# Patient Record
Sex: Female | Born: 1990 | Race: Black or African American | Hispanic: No | Marital: Single | Smoking: Never smoker
Health system: Southern US, Community
[De-identification: ages and names within clinical notes are randomized; demographics above are authoritative.]

---
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
## 2011-08-16 ENCOUNTER — Other Ambulatory Visit (HOSPITAL_COMMUNITY)
Admission: RE | Admit: 2011-08-16 | Discharge: 2011-08-16 | Disposition: A | Discharge status: Home / Self Care | Payer: Self-pay | Source: Ambulatory Visit | Attending: Internal Medicine | Admitting: Internal Medicine

## 2011-08-16 DIAGNOSIS — Z01419 Encounter for gynecological examination (general) (routine) without abnormal findings: Secondary | ICD-10-CM | POA: Insufficient documentation

## 2013-07-30 ENCOUNTER — Encounter (HOSPITAL_COMMUNITY): Payer: Self-pay | Admitting: Emergency Medicine

## 2013-07-30 ENCOUNTER — Emergency Department (INDEPENDENT_AMBULATORY_CARE_PROVIDER_SITE_OTHER)
Admission: EM | Admit: 2013-07-30 | Discharge: 2013-07-30 | Disposition: A | Discharge status: Home / Self Care | Source: Home / Self Care | Attending: Family Medicine | Admitting: Family Medicine

## 2013-07-30 DIAGNOSIS — J00 Acute nasopharyngitis [common cold]: Secondary | ICD-10-CM

## 2013-07-30 DIAGNOSIS — J040 Acute laryngitis: Secondary | ICD-10-CM

## 2013-07-30 MED ORDER — IPRATROPIUM BROMIDE 0.06 % NA SOLN: 2.0000 | Freq: Four times a day (QID) | NASAL | Status: DC

## 2013-07-30 MED ORDER — HYDROCOD POLST-CHLORPHEN POLST 10-8 MG/5ML PO LQCR: 2.5000 mL | Freq: Two times a day (BID) | ORAL | Status: DC | PRN

## 2013-07-30 MED ORDER — DOXYCYCLINE HYCLATE 100 MG PO CAPS: 100.0000 mg | ORAL_CAPSULE | Freq: Two times a day (BID) | ORAL | Status: DC

## 2013-07-30 MED ORDER — METHYLPREDNISOLONE 4 MG PO KIT: PACK | ORAL | Status: DC

## 2013-07-30 NOTE — ED Provider Notes (Signed)
CSN: [id number]     Arrival date & time 07/30/13  1119 History   First MD Initiated Contact with Patient 07/30/13 1330     Chief Complaint  Patient presents with  . Sore Throat   (Consider location/radiation/quality/duration/timing/severity/associated sxs/prior Treatment) HPI Comments: 22 year old female presents complaining of being sick for approximately one week. Started one week ago, she began having a sore throat. The sore throat has been persistent and has gotten worse over the period second day, she had vomiting throughout the day but has not had any more nausea or vomiting. The next day, she began to develop a cough. The cough has been dry, persistent, and has been keeping her awake at night. Starting this morning, she began to have hoarseness in her voice. She had some shortness of breath last night as well. She has been taking over-the-counter medications with only mild relief of her symptoms. No fever, chills, abdominal pain, lightheadedness, rash, chest pain  Patient is a 22 y.o. female presenting with pharyngitis.  Sore Throat Associated symptoms include shortness of breath. Pertinent negatives include no chest pain and no abdominal pain.    History reviewed. No pertinent past medical history. History reviewed. No pertinent past surgical history. History reviewed. No pertinent family history. History  Substance Use Topics  . Smoking status: Never Smoker   . Smokeless tobacco: Not on file  . Alcohol Use: No   OB History   Grav Para Term Preterm Abortions TAB SAB Ect Mult Living                 Review of Systems  Constitutional: Negative for fever and chills.  HENT: Positive for congestion, postnasal drip, rhinorrhea and sore throat. Negative for ear pain, sinus pressure and sneezing.   Eyes: Negative for visual disturbance.  Respiratory: Positive for cough and shortness of breath. Negative for chest tightness.   Cardiovascular: Negative for chest pain, palpitations and  leg swelling.  Gastrointestinal: Positive for vomiting. Negative for nausea and abdominal pain.  Endocrine: Negative for polydipsia and polyuria.  Genitourinary: Negative for dysuria, urgency and frequency.  Musculoskeletal: Negative for arthralgias and myalgias.  Skin: Negative for rash.  Neurological: Negative for dizziness, weakness and light-headedness.    Allergies  Review of patient's allergies indicates no known allergies.  Home Medications   Current Outpatient Rx  Name  Route  Sig  Dispense  Refill  . chlorpheniramine-HYDROcodone (TUSSIONEX PENNKINETIC ER) 10-8 MG/5ML LQCR   Oral   Take 2.5-5 mLs by mouth every 12 (twelve) hours as needed for cough.   115 mL   0   . doxycycline (VIBRAMYCIN) 100 MG capsule   Oral   Take 1 capsule (100 mg total) by mouth 2 (two) times daily.   20 capsule   0   . ipratropium (ATROVENT) 0.06 % nasal spray   Each Nare   Place 2 sprays into both nostrils 4 (four) times daily.   15 mL   12   . methylPREDNISolone (MEDROL DOSEPAK) 4 MG tablet      follow package directions   21 tablet   0     Dispense as written.    BP 112/70  Pulse 66  Temp(Src) 98.5 F (36.9 C) (Oral)  Resp 16  SpO2 100%  LMP 07/16/2013 Physical Exam  Nursing note and vitals reviewed. Constitutional: She is oriented to person, place, and time. Vital signs are normal. She appears well-developed and well-nourished. No distress.  HENT:  Head: Normocephalic and atraumatic.  Right Ear:  External ear normal.  Left Ear: External ear normal.  Nose: Nose normal.  Mouth/Throat: Oropharynx is clear and moist. No oropharyngeal exudate.  Eyes: Conjunctivae are normal. Right eye exhibits no discharge. Left eye exhibits no discharge.  Neck: Normal range of motion. Neck supple.  Cardiovascular: Normal rate, regular rhythm and normal heart sounds.   Pulmonary/Chest: Effort normal and breath sounds normal. No respiratory distress. She has no wheezes. She has no rales.   Abdominal: Soft. There is no tenderness.  Lymphadenopathy:    She has cervical adenopathy (tonsillar on the right only ).  Neurological: She is alert and oriented to person, place, and time. She has normal strength. Coordination normal.  Skin: Skin is warm and dry. No rash noted. She is not diaphoretic.  Psychiatric: She has a normal mood and affect. Judgment normal.    ED Course  Procedures (including critical care time) Labs Review Labs Reviewed - No data to display Imaging Review No results found.    MDM   1. Acute nasopharyngitis (common cold)   2. Laryngitis    Patient is afebrile, nontoxic. Her symptoms have gotten worse over one week.  We'll treat symptomatically for 2 days and give postdated prescription for antibiotics, she will wait 2 days prior to filling antibiotic to see if she starts to get better. Followup PRN   New Prescriptions   CHLORPHENIRAMINE-HYDROCODONE (TUSSIONEX PENNKINETIC ER) 10-8 MG/5ML LQCR    Take 2.5-5 mLs by mouth every 12 (twelve) hours as needed for cough.   DOXYCYCLINE (VIBRAMYCIN) 100 MG CAPSULE    Take 1 capsule (100 mg total) by mouth 2 (two) times daily.   IPRATROPIUM (ATROVENT) 0.06 % NASAL SPRAY    Place 2 sprays into both nostrils 4 (four) times daily.   METHYLPREDNISOLONE (MEDROL DOSEPAK) 4 MG TABLET    follow package directions       Graylon Good, PA-C 07/30/13 1347

## 2013-07-30 NOTE — ED Provider Notes (Signed)
Medical screening examination/treatment/procedure(s) were performed by resident physician or non-physician practitioner and as supervising physician I was immediately available for consultation/collaboration.   KINDL,JAMES DOUGLAS MD.   James D Kindl, MD 07/30/13 1651 

## 2013-07-30 NOTE — ED Notes (Signed)
C/o productive cough with yellow sputum. Chills and sore throat.  Denies fever, n/v/d.  Pt has tried thera flu with no relief.  On set Sunday.

## 2013-09-13 ENCOUNTER — Encounter (HOSPITAL_COMMUNITY): Payer: Self-pay | Admitting: Emergency Medicine

## 2013-09-13 ENCOUNTER — Emergency Department (INDEPENDENT_AMBULATORY_CARE_PROVIDER_SITE_OTHER)
Admission: EM | Admit: 2013-09-13 | Discharge: 2013-09-13 | Disposition: A | Discharge status: Home / Self Care | Source: Home / Self Care

## 2013-09-13 ENCOUNTER — Emergency Department (INDEPENDENT_AMBULATORY_CARE_PROVIDER_SITE_OTHER)

## 2013-09-13 DIAGNOSIS — R0989 Other specified symptoms and signs involving the circulatory and respiratory systems: Secondary | ICD-10-CM

## 2013-09-13 DIAGNOSIS — R6889 Other general symptoms and signs: Secondary | ICD-10-CM

## 2013-09-13 NOTE — ED Provider Notes (Signed)
CSN: [id number]     Arrival date & time 09/13/13  0932 History   First MD Initiated Contact with Patient 09/13/13 0957     Chief Complaint  Patient presents with  . Swallowed Foreign Body   (Consider location/radiation/quality/duration/timing/severity/associated sxs/prior Treatment) HPI Comments: 23 year old female was eating pizza last and then suddenly felt a foreign body sensation in the anterior throat at the level of the thyroid cartilage. This occurred approximately 11 PM. She is able to swallow but it causes discomfort in the throat upon swallowing. She has no drooling and she is not spitting out fluids or saliva. She is able to drink water and other fluids and eat crackers without problems. She has noted sensation of choking problems breathing or cough. She is sitting on the exam table talking and sitting in a relaxed position. She is in no distress.   History reviewed. No pertinent past medical history. History reviewed. No pertinent past surgical history. History reviewed. No pertinent family history. History  Substance Use Topics  . Smoking status: Never Smoker   . Smokeless tobacco: Not on file  . Alcohol Use: No   OB History   Grav Para Term Preterm Abortions TAB SAB Ect Mult Living                 Review of Systems  Constitutional: Negative.   HENT: Positive for postnasal drip, rhinorrhea and sinus pressure. Negative for congestion and sore throat.        As per history of present illness Airway is widely patent, no stridor or other abnormal sounds from the throat,  Respiratory: Negative.   Cardiovascular: Negative.   Gastrointestinal: Negative.   Skin: Negative.     Allergies  Review of patient's allergies indicates no known allergies.  Home Medications   No current outpatient prescriptions on file. BP 110/78  Pulse 78  Temp(Src) 98.6 F (37 C) (Oral)  Resp 16  SpO2 97%  LMP 08/19/2013 Physical Exam  Nursing note and vitals reviewed. Constitutional:  She is oriented to person, place, and time. She appears well-developed and well-nourished. No distress.  HENT:  Mouth/Throat: No oropharyngeal exudate.  Oropharynx is mildly erythema, cobblestoning and clear PND. Airway is widely patent there are no abnormal lesions, swelling or signs of abscess formation.  Eyes: Conjunctivae and EOM are normal.  Neck: Normal range of motion. Neck supple.  Bilateral cervical anterior chain lymph nodes, small, mobile and nontender.  Cardiovascular: Normal rate, regular rhythm and normal heart sounds.   Pulmonary/Chest: Effort normal and breath sounds normal. No respiratory distress.  Lymphadenopathy:    She has cervical adenopathy.  Neurological: She is alert and oriented to person, place, and time. She exhibits normal muscle tone.  Skin: Skin is warm and dry.  Psychiatric: She has a normal mood and affect.    ED Course  Procedures (including critical care time) Labs Review Labs Reviewed - No data to display Imaging Review Dg Neck Soft Tissue  09/13/2013   CLINICAL DATA:  Swallowed a foreign body.  EXAM: NECK SOFT TISSUES - 1+ VIEW  COMPARISON:  None.  FINDINGS: There is no evidence of retropharyngeal soft tissue swelling or epiglottic enlargement. The cervical airway is unremarkable and no radio-opaque foreign body identified. There is calcification along the posterior margin of the thyroid cartilage.  IMPRESSION: Negative.   Electronically Signed   By: Amie Portlandavid  Ormond M.D.   On: 09/13/2013 10:44      MDM   1. Foreign body sensation in throat  For problems discussed and written on your instructions  go to to the ED Otherwise call the ENT above for appointment    Hayden Rasmussen, NP 09/13/13 1118

## 2013-09-13 NOTE — ED Notes (Signed)
PT    HAS  A  SENSATION OF  SOMETHING  IIN   HER  THROAT  AFTER  EATING  PIZZA     LAST  PM   SHE  APPEARS IN NO  DOISTRESS   SITTING  UPRIGHT ON  EXAM TABLE  IN NO  ACUTE  DISTRESS

## 2013-09-13 NOTE — Discharge Instructions (Signed)
Choking, Adult For problems with breathing, swallowing, drooling, choking go to the emergency department Choking occurs when a food or object gets stuck in the throat or trachea, blocking the airway. If the airway is partly blocked, coughing will usually cause the food or object to come out. If the airway is completely blocked, immediate action is needed to help it come out. A complete airway blockage is life-threatening because it causes breathing to stop. Choking is a true medical emergency that requires fast, appropriate action by anyone available. SIGNS OF AIRWAY BLOCKAGE There is a partial airway blockage if you or the person who is choking is:   Able to breathe and speak.  Coughing loudly.  Making loud noises. There is a complete airway blockage if you or the person who is choking is:   Unable to breathe.  Making soft or high-pitched sounds while breathing.  Unable to cough or coughing weakly, ineffectively, or silently.  Unable to cry, speak, or make sounds.  Turning blue.  Holding the neck with both arms. This is the universal sign of choking. WHAT TO DO IF CHOKING OCCURS If there is a partial airway blockage, allow coughing to clear the airway. Do not try to drink until the food or object comes out. If someone else has a partial airway blockage, do not interfere. Stay with him or her and watch for signs of complete airway blockage until the food or object comes out.  If there is a complete airway blockage or if there is a partial airway blockage and the food or object does not come out, perform abdominal thrusts (also referred to as the Heimlich maneuver). Abdominal thrusts are used to create an artificial cough to try to clear the airway. Performing abdominal thrusts is part of a series of steps that should be done to help someone who is choking. Abdominal thrusts are usually done by someone else, but if you are alone, you can perform abdominal thrusts on yourself. Follow the  procedure below that best fits your situation.  IF SOMEONE ELSE IS CHOKING: For a conscious adult:  1. Ask the person whether he or she is choking. If the person nods, continue to step 2. 2. Stand or kneel behind the person and lean him or her forward slightly. 3. Make a fist with 1 hand, put your arms around the person, and grasp your fist with your other hand. Place the thumb side of your fist in the person's abdomen, just below the ribs. 4. Press inward and upward with both hands. 5. Repeat this maneuver until the object comes out and the person is able to breathe or until the person loses consciousness. For an unconcious adult: 1. Shout for help. If someone responds, have him or her call local emergency services (911 in [place] If no one responds, call local emergency services yourself if possible. 2. Begin CPR, starting with compressions. Every time you open the airway to give rescue breaths, open the person's mouth. If you can see the food or object and it can be easily pulled out, remove it with your fingers. 3. After 5 cycles or 2 minutes of CPR, call local emergency services (911 in [place] if you or someone else did not already call. For a conscious adult who is obese or in the later stages of pregnancy: Abdominal thrusts may not be effective when helping people who are in the later stages of pregnancy or who are obese. In these instances, chest thrusts can be used.  1. Ask  the person whether he or she is choking. If the person nods and has signs of complete airway blockage, continue to step 2. 2. Stand behind the person and wrap your arms around his or her chest (with your arms under the person's armpits). 3. Make a fist with 1 hand. Place the thumb side of your fist on the middle of the person's breastbone. 4. Grab your fist with your other hand and thrust backward. Continue this until the object comes out or until the person becomes unconscious. For an unconscious adult who is obese or in  the later stages of pregnancy:  1. Shout for help. If someone responds, have him or her call local emergency services (911 in [place] If no one responds, call local emergency services yourself if possible. 2. Begin CPR, starting with compressions. Every time you open the airway to give rescue breaths, open the person's mouth. If you can see the food or object and it can be easily pulled out, remove it with your fingers. 3. After 5 cycles or 2 minutes of CPR, call local emergency services (911 in [place] if you or someone else did not already call. Note that abdominal thrusts (below the rib cage) should be used for a pregnant woman if possible. This should be possible until the later stages of pregnancy when there is no longer enough room between the enlarging uterus and the rib cage to perform the maneuver. At that point, chest thrusts must be used as described. IF YOU ARE CHOKING: 1. Call local emergency services (911 in [place] if near a landline. Do not worry about communicating what is happening. Do not hang up the phone. Someone may be sent to help you anyway. 2. Make a fist with 1 hand. Put the thumb side of the fist against your stomach, just above the belly button and well below the breastbone. If you are pregnant or obese, put your fist on your chest instead, just below the breastbone and just above your lowest ribs. 3. Hold your fist with your other hand and bend over a hard surface, such as a table or chair. 4. Forcefully push your fist in and up. 5. Continue to do this until the food or object comes out. PREVENTION  To be prepared if choking occurs, learn how to correctly perform abdominal thrusts and give CPR by taking a certified first-aid training course.  SEEK IMMEDIATE MEDICAL CARE IF:  You have a fever after choking stops.  You have problems breathing after choking stops.  You received the Heimlich maneuver. MAKE SURE YOU:  Understand these instructions.  Watch your  condition.  Get help right away if you are not doing well or get worse. Document Released: 09/05/2004 Document Revised: 04/22/2012 Document Reviewed: 03/10/2012 [facility] Patient Information 2014 [place], [place].

## 2013-09-16 NOTE — ED Provider Notes (Signed)
Medical screening examination/treatment/procedure(s) were performed by a resident physician or non-physician practitioner and as the supervising physician I was immediately available for consultation/collaboration.  Clementeen GrahamEvan Corey, MD    Rodolph BongEvan S Corey, MD 09/16/13 [phone]

## 2013-11-09 ENCOUNTER — Encounter (HOSPITAL_COMMUNITY): Payer: Self-pay | Admitting: Emergency Medicine

## 2013-11-09 ENCOUNTER — Emergency Department (INDEPENDENT_AMBULATORY_CARE_PROVIDER_SITE_OTHER)
Admission: EM | Admit: 2013-11-09 | Discharge: 2013-11-09 | Disposition: A | Discharge status: Home / Self Care | Payer: Self-pay | Source: Home / Self Care | Attending: Emergency Medicine | Admitting: Emergency Medicine

## 2013-11-09 DIAGNOSIS — S39012A Strain of muscle, fascia and tendon of lower back, initial encounter: Secondary | ICD-10-CM

## 2013-11-09 DIAGNOSIS — S139XXA Sprain of joints and ligaments of unspecified parts of neck, initial encounter: Secondary | ICD-10-CM

## 2013-11-09 DIAGNOSIS — S161XXA Strain of muscle, fascia and tendon at neck level, initial encounter: Secondary | ICD-10-CM

## 2013-11-09 MED ORDER — MELOXICAM 15 MG PO TABS: 15.0000 mg | ORAL_TABLET | Freq: Every day | ORAL | Status: AC

## 2013-11-09 MED ORDER — CYCLOBENZAPRINE HCL 5 MG PO TABS: 5.0000 mg | ORAL_TABLET | Freq: Three times a day (TID) | ORAL | Status: AC | PRN

## 2013-11-09 MED ORDER — HYDROCODONE-ACETAMINOPHEN 5-325 MG PO TABS: ORAL_TABLET | ORAL | Status: AC

## 2013-11-09 NOTE — Discharge Instructions (Signed)
Do exercises twice daily followed by moist heat for 15 minutes. ° ° ° ° ° °Try to be as active as possible. ° °If no better in 2 weeks, follow up with orthopedist. ° ° °TREATMENT  °Treatment initially involves the use of ice and medication to help reduce pain and inflammation. It is also important to perform strengthening and stretching exercises and modify activities that worsen symptoms so the injury does not get worse. These exercises may be performed at home or with a therapist. For patients who experience severe symptoms, a soft padded collar may be recommended to be worn around the neck.  °Improving your posture may help reduce symptoms. Posture improvement includes pulling your chin and abdomen in while sitting or standing. If you are sitting, sit in a firm chair with your buttocks against the back of the chair. While sleeping, try replacing your pillow with a small towel rolled to 2 inches in diameter, or use a cervical pillow. Poor sleeping positions delay healing.  ° °MEDICATION  °· If pain medication is necessary, nonsteroidal anti-inflammatory medications, such as aspirin and ibuprofen, or other minor pain relievers, such as acetaminophen, are often recommended. °· Do not take pain medication for 7 days before surgery. °· Prescription pain relievers may be given if deemed necessary by your caregiver. Use only as directed and only as much as you need. ° °HEAT AND COLD:  °· Cold treatment (icing) relieves pain and reduces inflammation. Cold treatment should be applied for 10 to 15 minutes every 2 to 3 hours for inflammation and pain and immediately after any activity that aggravates your symptoms. Use ice packs or an ice massage. °· Heat treatment may be used prior to performing the stretching and strengthening activities prescribed by your caregiver, physical therapist, or athletic trainer. Use a heat pack or a warm soak. ° °SEEK MEDICAL CARE IF:  °· Symptoms get worse or do not improve in 2 weeks despite  treatment. °· New, unexplained symptoms develop (drugs used in treatment may produce side effects). ° °EXERCISES °RANGE OF MOTION (ROM) AND STRETCHING EXERCISES - Cervical Strain and Sprain °These exercises may help you when beginning to rehabilitate your injury. In order to successfully resolve your symptoms, you must improve your posture. These exercises are designed to help reduce the forward-head and rounded-shoulder posture which contributes to this condition. Your symptoms may resolve with or without further involvement from your physician, physical therapist or athletic trainer. While completing these exercises, remember:  °· Restoring tissue flexibility helps normal motion to return to the joints. This allows healthier, less painful movement and activity. °· An effective stretch should be held for at least 20 seconds, although you may need to begin with shorter hold times for comfort. °· A stretch should never be painful. You should only feel a gentle lengthening or release in the stretched tissue. ° °STRETCH- Axial Extensors °· Lie on your back on the floor. You may bend your knees for comfort. Place a rolled up hand towel or dish towel, about 2 inches in diameter, under the part of your head that makes contact with the floor. °· Gently tuck your chin, as if trying to make a "double chin," until you feel a gentle stretch at the base of your head. °· Hold _____10_____ seconds. °Repeat _____10_____ times. Complete this exercise _____2_____ times per day.  ° °STRETECH - Axial Extension  °· Stand or sit on a firm surface. Assume a good posture: chest up, shoulders drawn back, abdominal muscles slightly   tense, knees unlocked (if standing) and feet hip width apart. °· Slowly retract your chin so your head slides back and your chin slightly lowers.Continue to look straight ahead. °· You should feel a gentle stretch in the back of your head. Be certain not to feel an aggressive stretch since this can cause  headaches later. °· Hold for ____10______ seconds. °Repeat _____10_____ times. Complete this exercise ____2______ times per day. ° °STRETCH  Cervical Side Bend  °· Stand or sit on a firm surface. Assume a good posture: chest up, shoulders drawn back, abdominal muscles slightly tense, knees unlocked (if standing) and feet hip width apart. °· Without letting your nose or shoulders move, slowly tip your right / left ear to your shoulder until your feel a gentle stretch in the muscles on the opposite side of your neck. °· Hold _____10_____ seconds. °Repeat _____10_____ times. Complete this exercise _____2_____ times per day. ° °STRETCH  Cervical Rotators  °· Stand or sit on a firm surface. Assume a good posture: chest up, shoulders drawn back, abdominal muscles slightly tense, knees unlocked (if standing) and feet hip width apart. °· Keeping your eyes level with the ground, slowly turn your head until you feel a gentle stretch along the back and opposite side of your neck. °· Hold _____10_____ seconds. °Repeat ____10______ times. Complete this exercise ____2______ times per day. ° °RANGE OF MOTION - Neck Circles  °· Stand or sit on a firm surface. Assume a good posture: chest up, shoulders drawn back, abdominal muscles slightly tense, knees unlocked (if standing) and feet hip width apart. °· Gently roll your head down and around from the back of one shoulder to the back of the other. The motion should never be forced or painful. °· Repeat the motion 10-20 times, or until you feel the neck muscles relax and loosen. °Repeat ____10______ times. Complete the exercise _____2_____ times per day. ° °STRENGTHENING EXERCISES - Cervical Strain and Sprain °These exercises may help you when beginning to rehabilitate your injury. They may resolve your symptoms with or without further involvement from your physician, physical therapist or athletic trainer. While completing these exercises, remember:  °· Muscles can gain both the  endurance and the strength needed for everyday activities through controlled exercises. °· Complete these exercises as instructed by your physician, physical therapist or athletic trainer. Progress the resistance and repetitions only as guided. °· You may experience muscle soreness or fatigue, but the pain or discomfort you are trying to eliminate should never worsen during these exercises. If this pain does worsen, stop and make certain you are following the directions exactly. If the pain is still present after adjustments, discontinue the exercise until you can discuss the trouble with your clinician. ° °STRENGTH Cervical Flexors, Isometric °· Face a wall, standing about 6 inches away. Place a small pillow, a ball about 6-8 inches in diameter, or a folded towel between your forehead and the wall. °· Slightly tuck your chin and gently push your forehead into the soft object. Push only with mild to moderate intensity, building up tension gradually. Keep your jaw and forehead relaxed. °· Hold 10 to 20 seconds. Keep your breathing relaxed. °· Release the tension slowly. Relax your neck muscles completely before you start the next repetition. °Repeat _____10_____ times. Complete this exercise _____2_____ times per day. ° °STRENGTH- Cervical Lateral Flexors, Isometric  °· Stand about 6 inches away from a wall. Place a small pillow, a ball about 6-8 inches in diameter, or a folded   towel between the side of your head and the wall. °· Slightly tuck your chin and gently tilt your head into the soft object. Push only with mild to moderate intensity, building up tension gradually. Keep your jaw and forehead relaxed. °· Hold 10 to 20 seconds. Keep your breathing relaxed. °· Release the tension slowly. Relax your neck muscles completely before you start the next repetition. °Repeat _____10_____ times. Complete this exercise ____2______ times per day. ° °STRENGTH  Cervical Extensors, Isometric  °· Stand about 6 inches away from  a wall. Place a small pillow, a ball about 6-8 inches in diameter, or a folded towel between the back of your head and the wall. °· Slightly tuck your chin and gently tilt your head back into the soft object. Push only with mild to moderate intensity, building up tension gradually. Keep your jaw and forehead relaxed. °· Hold 10 to 20 seconds. Keep your breathing relaxed. °· Release the tension slowly. Relax your neck muscles completely before you start the next repetition. °Repeat _____10_____ times. Complete this exercise _____2_____ times per day. ° °POSTURE AND BODY MECHANICS CONSIDERATIONS - Cervical Strain and Sprain °Keeping correct posture when sitting, standing or completing your activities will reduce the stress put on different body tissues, allowing injured tissues a chance to heal and limiting painful experiences. The following are general guidelines for improved posture. Your physician or physical therapist will provide you with any instructions specific to your needs. While reading these guidelines, remember: °· The exercises prescribed by your provider will help you have the flexibility and strength to maintain correct postures. °· The correct posture provides the optimal environment for your joints to work. All of your joints have less wear and tear when properly supported by a spine with good posture. This means you will experience a healthier, less painful body. °· Correct posture must be practiced with all of your activities, especially prolonged sitting and standing. Correct posture is as important when doing repetitive low-stress activities (typing) as it is when doing a single heavy-load activity (lifting). °PROLONGED STANDING WHILE SLIGHTLY LEANING FORWARD °When completing a task that requires you to lean forward while standing in one place for a long time, place either foot up on a stationary 2-4 inch high object to help maintain the best posture. When both feet are on the ground, the low  back tends to lose its slight inward curve. If this curve flattens (or becomes too large), then the back and your other joints will experience too much stress, fatigue more quickly and can cause pain.  °RESTING POSITIONS °Consider which positions are most painful for you when choosing a resting position. If you have pain with flexion-based activities (sitting, bending, stooping, squatting), choose a position that allows you to rest in a less flexed posture. You would want to avoid curling into a fetal position on your side. If your pain worsens with extension-based activities (prolonged standing, working overhead), avoid resting in an extended position such as sleeping on your stomach. Most people will find more comfort when they rest with their spine in a more neutral position, neither too rounded nor too arched. Lying on a non-sagging bed on your side with a pillow between your knees, or on your back with a pillow under your knees will often provide some relief. Keep in mind, being in any one position for a prolonged period of time, no matter how correct your posture, can still lead to stiffness. °WALKING °Walk with an upright posture. Your ears,   shoulders and hips should all line-up. °OFFICE WORK °When working at a desk, create an environment that supports good, upright posture. Without extra support, muscles fatigue and lead to excessive strain on joints and other tissues. °CHAIR: °· A chair should be able to slide under your desk when your back makes contact with the back of the chair. This allows you to work closely. °· The chair's height should allow your eyes to be level with the upper part of your monitor and your hands to be slightly lower than your elbows. °· Body position: °· Your feet should make contact with the floor. If this is not possible, use a foot rest. °· Keep your ears over your shoulders. This will reduce stress on your neck and low back. °Document Released: 07/29/2005 Document Revised:  10/21/2011 Document Reviewed: 11/10/2008 °[employer]® Patient Information ©2013 [employer]. ° ° °
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
## 2014-08-03 IMAGING — CR DG NECK SOFT TISSUE
2 series · 2 of 2 positions shown · non-contrast
Comparison: None.

CLINICAL DATA: Swallowed a foreign body.

EXAM:
NECK SOFT TISSUES - 1+ VIEW

[view not recorded (1 of 2)]
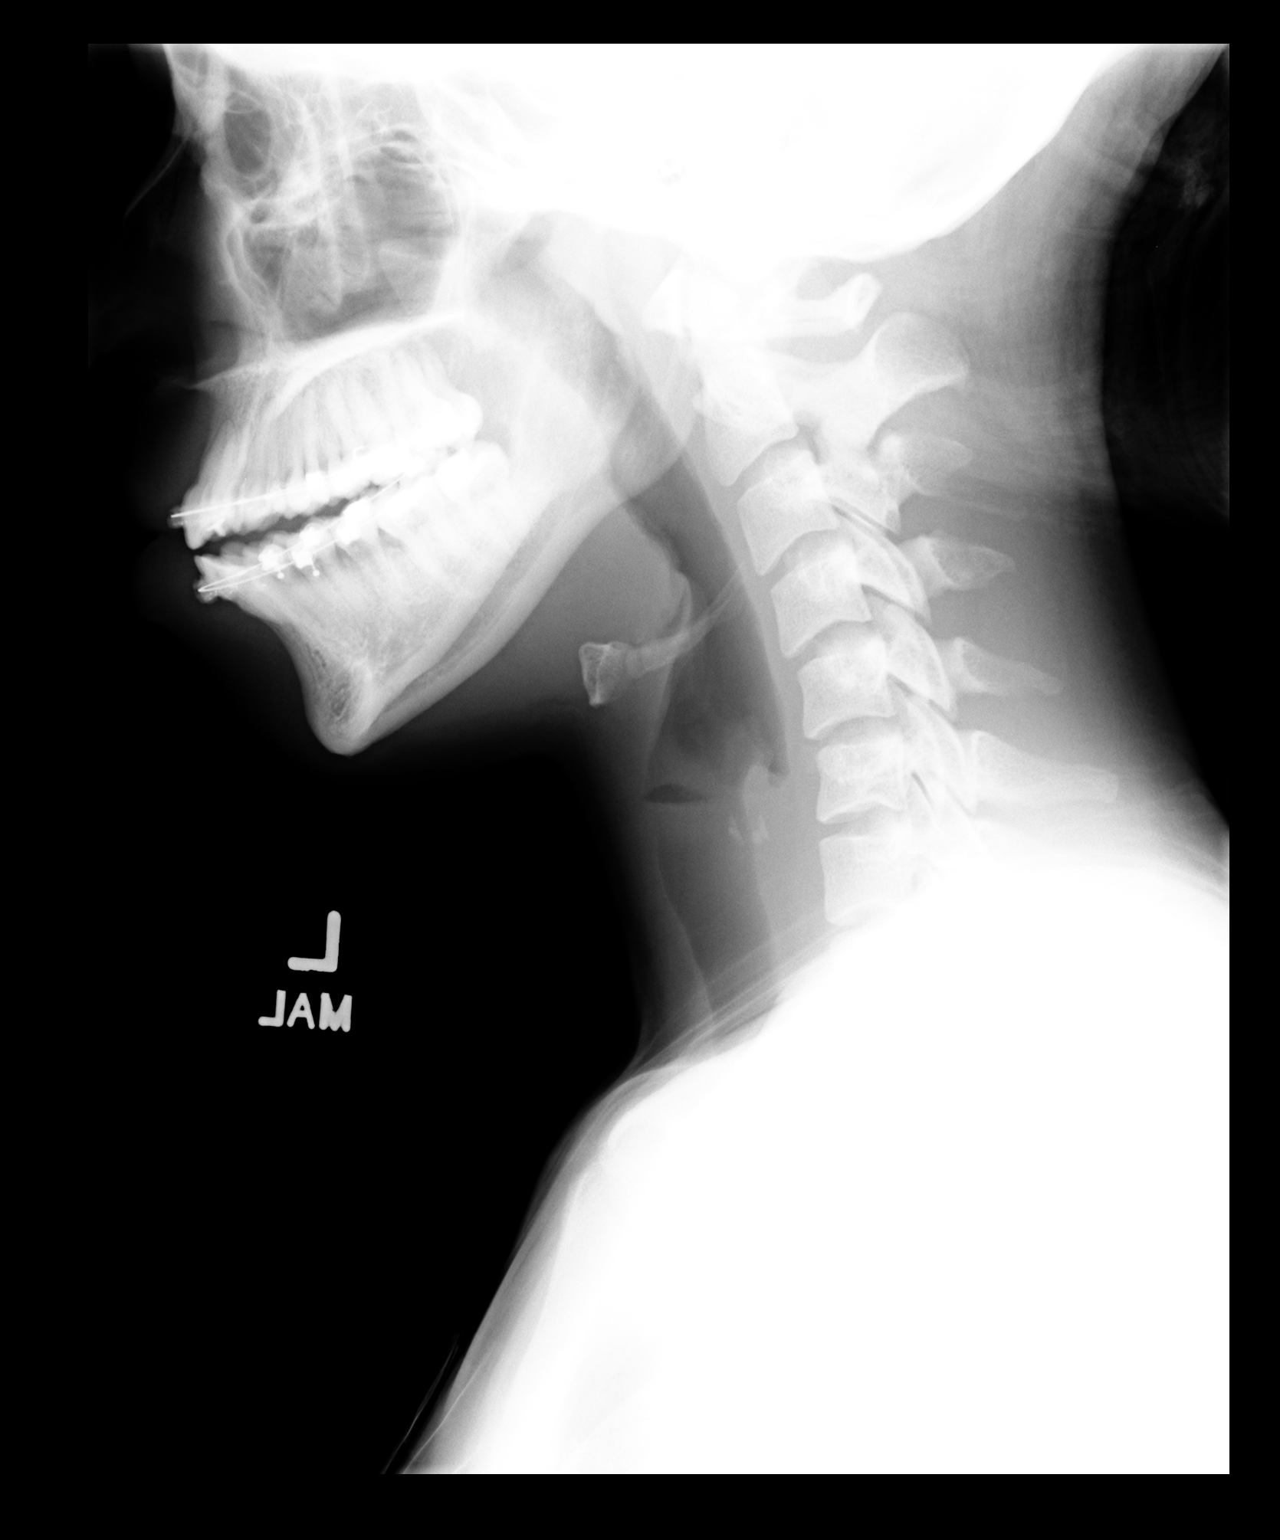

[view not recorded (2 of 2)]
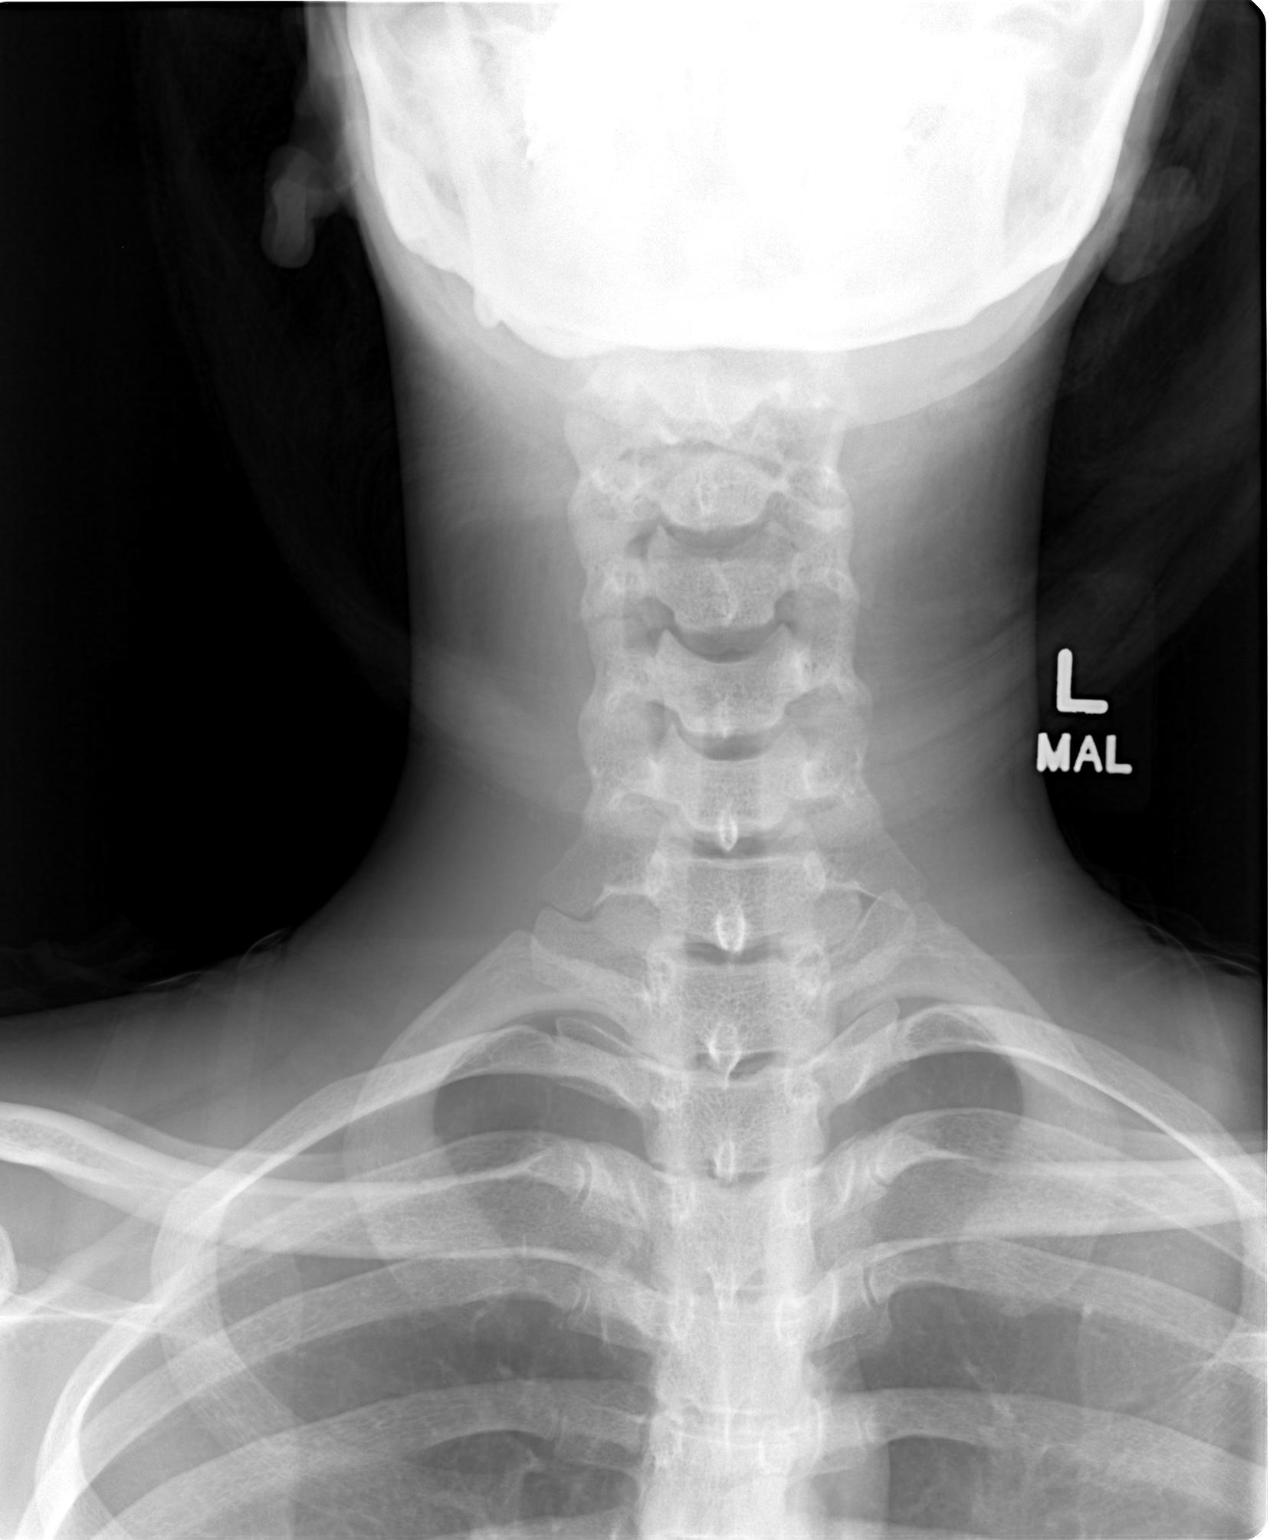

[2 of 2 positions shown; findings below may reference images not displayed]

FINDINGS: There is no evidence of retropharyngeal soft tissue swelling or
epiglottic enlargement. The cervical airway is unremarkable and no
radio-opaque foreign body identified. There is calcification along
the posterior margin of the thyroid cartilage.
IMPRESSION: Negative.

## 2014-09-21 ENCOUNTER — Emergency Department (HOSPITAL_COMMUNITY)
Admission: EM | Admit: 2014-09-21 | Discharge: 2014-09-21 | Disposition: A | Discharge status: Home / Self Care | Payer: No Typology Code available for payment source | Source: Home / Self Care | Attending: Family Medicine | Admitting: Family Medicine

## 2014-09-21 ENCOUNTER — Encounter (HOSPITAL_COMMUNITY): Payer: Self-pay | Admitting: Emergency Medicine

## 2014-09-21 DIAGNOSIS — N390 Urinary tract infection, site not specified: Secondary | ICD-10-CM

## 2014-09-21 LAB — POCT URINALYSIS DIP (DEVICE)
Bilirubin Urine: NEGATIVE
GLUCOSE, UA: NEGATIVE mg/dL
Ketones, ur: NEGATIVE mg/dL
Nitrite: POSITIVE — AB
Protein, ur: 100 mg/dL — AB
Specific Gravity, Urine: >=1.03 (ref 1.005–1.030)
UROBILINOGEN UA: 0.2 mg/dL (ref 0.0–1.0)
pH: 6 (ref 5.0–8.0)

## 2014-09-21 LAB — POCT PREGNANCY, URINE: Preg Test, Ur: NEGATIVE

## 2014-09-21 MED ORDER — PHENAZOPYRIDINE HCL 200 MG PO TABS: 200.0000 mg | ORAL_TABLET | Freq: Three times a day (TID) | ORAL | Status: AC

## 2014-09-21 MED ORDER — CEFUROXIME AXETIL 250 MG PO TABS: 250.0000 mg | ORAL_TABLET | Freq: Two times a day (BID) | ORAL | Status: AC

## 2014-09-21 NOTE — ED Provider Notes (Signed)
CSN: [id number]     Arrival date & time 09/21/14  0824 History   First MD Initiated Contact with Patient 09/21/14 [phone]     Chief Complaint  Patient presents with  . Urinary Tract Infection   (Consider location/radiation/quality/duration/timing/severity/associated sxs/prior Treatment) HPI        24 year old female presents for evaluation of probable urinary tract infection. She has urinary frequency and dysuria for 3 days. Symptoms are gradually worsening. She denies any fever, chills, NVD, abdominal pain, or flank pain. She has a history of UTI that felt similar. No recent hospitalizations or catheterizations.  History reviewed. No pertinent past medical history. History reviewed. No pertinent past surgical history. No family history on file. History  Substance Use Topics  . Smoking status: Never Smoker   . Smokeless tobacco: Not on file  . Alcohol Use: No   OB History    No data available     Review of Systems  Gastrointestinal: Negative for nausea, vomiting, abdominal pain and diarrhea.  Genitourinary: Positive for dysuria and frequency. Negative for flank pain.  All other systems reviewed and are negative.   Allergies  Review of patient's allergies indicates no known allergies.  Home Medications   Prior to Admission medications   Medication Sig Start Date End Date Taking? Authorizing Provider  aspirin 325 MG tablet Take 325 mg by mouth daily.    Historical Provider, MD  cefUROXime (CEFTIN) 250 MG tablet Take 1 tablet (250 mg total) by mouth 2 (two) times daily with a meal. 09/21/14   Graylon Good, PA-C  cyclobenzaprine (FLEXERIL) 5 MG tablet Take 1 tablet (5 mg total) by mouth 3 (three) times daily as needed for muscle spasms. 11/09/13   Reuben Likes, MD  HYDROcodone-acetaminophen (NORCO/VICODIN) 5-325 MG per tablet 1 to 2 tabs every 4 to 6 hours as needed for pain. 11/09/13   Reuben Likes, MD  meloxicam (MOBIC) 15 MG tablet Take 1 tablet (15 mg total) by mouth daily.  11/09/13   Reuben Likes, MD  phenazopyridine (PYRIDIUM) 200 MG tablet Take 1 tablet (200 mg total) by mouth 3 (three) times daily. 09/21/14   Adrian Blackwater Imelda Dandridge, PA-C   BP 105/63 mmHg  Pulse 62  Temp(Src) 97.1 F (36.2 C) (Oral)  Resp 18  SpO2 99%  LMP 09/14/2014 Physical Exam  Constitutional: She is oriented to person, place, and time. Vital signs are normal. She appears well-developed and well-nourished. No distress.  HENT:  Head: Normocephalic and atraumatic.  Pulmonary/Chest: Effort normal. No respiratory distress.  Abdominal: There is no tenderness. There is no CVA tenderness.  Neurological: She is alert and oriented to person, place, and time. She has normal strength. Coordination normal.  Skin: Skin is warm and dry. No rash noted. She is not diaphoretic.  Psychiatric: She has a normal mood and affect. Judgment normal.  Nursing note and vitals reviewed.   ED Course  Procedures (including critical care time) Labs Review Labs Reviewed  POCT URINALYSIS DIP (DEVICE) - Abnormal; Notable for the following:    Hgb urine dipstick LARGE (*)    Protein, ur 100 (*)    Nitrite POSITIVE (*)    Leukocytes, UA SMALL (*)    All other components within normal limits  URINE CULTURE  POCT PREGNANCY, URINE    Imaging Review No results found.   MDM   1. UTI (lower urinary tract infection)    Urine culture sent. Treat with Ceftin and Pyridium. Follow-up when necessary    Earna Coder  Myrtie NeitherH Jermichael Belmares, PA-C 09/21/14 [phone]

## 2014-09-21 NOTE — Discharge Instructions (Signed)

## 2014-09-21 NOTE — ED Notes (Signed)
C/o UTI sx onset 3 days Sx include: dysuria, urinary freq/urgency Denies gyn sx, abd/back pain, fevers, chills Alert, no signs of acute distress.

## 2014-09-23 LAB — URINE CULTURE: Colony Count: >=100000

## 2014-09-23 NOTE — ED Notes (Signed)
Urine culture: >100,000 colonies E. Coli.  Pt. adequately treated with Ceftin. Vassie MoselleYork, Nadyne Gariepy M 09/23/2014

## 2015-09-16 ENCOUNTER — Other Ambulatory Visit (HOSPITAL_COMMUNITY)
Admission: RE | Admit: 2015-09-16 | Discharge: 2015-09-16 | Disposition: A | Discharge status: Home / Self Care | Payer: BLUE CROSS/BLUE SHIELD | Source: Ambulatory Visit | Attending: Emergency Medicine | Admitting: Emergency Medicine

## 2015-09-16 ENCOUNTER — Emergency Department (HOSPITAL_COMMUNITY)
Admission: EM | Admit: 2015-09-16 | Discharge: 2015-09-16 | Disposition: A | Discharge status: Home / Self Care | Payer: BLUE CROSS/BLUE SHIELD | Source: Home / Self Care | Attending: Emergency Medicine | Admitting: Emergency Medicine

## 2015-09-16 ENCOUNTER — Encounter (HOSPITAL_COMMUNITY): Payer: Self-pay | Admitting: Emergency Medicine

## 2015-09-16 DIAGNOSIS — J029 Acute pharyngitis, unspecified: Secondary | ICD-10-CM

## 2015-09-16 LAB — POCT RAPID STREP A: Streptococcus, Group A Screen (Direct): NEGATIVE

## 2015-09-16 NOTE — ED Notes (Signed)
C/o ST onset 3 days associated w/dry cough Denies fevers, chills A&O x4... No acute distress.

## 2015-09-16 NOTE — Discharge Instructions (Signed)
Sore Throat Your strep test was negative. The test will be double tested and resulted in 2-3 days. If it is positive we will call you would treat over the phone. Treacher symptoms with ibuprofen 6 mg every 6 hours as needed. Cepacol lozenges for sore throat pain, drink plenty fluids and stay well-hydrated. A sore throat is a painful, burning, sore, or scratchy feeling of the throat. There may be pain or tenderness when swallowing or talking. You may have other symptoms with a sore throat. These include coughing, sneezing, fever, or a swollen neck. A sore throat is often the first sign of another sickness. These sicknesses may include a cold, flu, strep throat, or an infection called mono. Most sore throats go away without medical treatment.  HOME CARE   Only take medicine as told by your doctor.  Drink enough fluids to keep your pee (urine) clear or pale yellow.  Rest as needed.  Try using throat sprays, lozenges, or suck on hard candy (if older than 4 years or as told).  Sip warm liquids, such as broth, herbal tea, or warm water with honey. Try sucking on frozen ice pops or drinking cold liquids.  Rinse the mouth (gargle) with salt water. Mix 1 teaspoon salt with 8 ounces of water.  Do not smoke. Avoid being around others when they are smoking.  Put a humidifier in your bedroom at night to moisten the air. You can also turn on a hot shower and sit in the bathroom for 5-10 minutes. Be sure the bathroom door is closed. GET HELP RIGHT AWAY IF:   You have trouble breathing.  You cannot swallow fluids, soft foods, or your spit (saliva).  You have more puffiness (swelling) in the throat.  Your sore throat does not get better in 7 days.  You feel sick to your stomach (nauseous) and throw up (vomit).  You have a fever or lasting symptoms for more than 2-3 days.  You have a fever and your symptoms suddenly get worse. MAKE SURE YOU:   Understand these instructions.  Will watch your  condition.  Will get help right away if you are not doing well or get worse.   This information is not intended to replace advice given to you by your health care provider. Make sure you discuss any questions you have with your health care provider.   Document Released: 05/07/2008 Document Revised: 04/22/2012 Document Reviewed: 04/05/2012 [employer] Interactive Patient Education [employer]

## 2015-09-16 NOTE — ED Provider Notes (Signed)
CSN: [id number]     Arrival date & time 09/16/15  1435 History   First MD Initiated Contact with Patient 09/16/15 1545     Chief Complaint  Patient presents with  . Sore Throat   (Consider location/radiation/quality/duration/timing/severity/associated sxs/prior Treatment) HPI Comments: 25 year old female complaining of a sore throat for 3-4 days. Also associated with cough and light PND.   History reviewed. No pertinent past medical history. History reviewed. No pertinent past surgical history. No family history on file. Social History  Substance Use Topics  . Smoking status: Never Smoker   . Smokeless tobacco: None  . Alcohol Use: No   OB History    No data available     Review of Systems  Constitutional: Negative for fever, activity change and fatigue.  HENT: Positive for postnasal drip. Negative for congestion, drooling, ear pain, mouth sores, rhinorrhea, sinus pressure and trouble swallowing.   Eyes: Negative.   Respiratory: Positive for cough. Negative for shortness of breath.   Cardiovascular: Negative.   All other systems reviewed and are negative.   Allergies  Review of patient's allergies indicates no known allergies.  Home Medications   Prior to Admission medications   Medication Sig Start Date End Date Taking? Authorizing Provider  aspirin 325 MG tablet Take 325 mg by mouth daily.    Historical Provider, MD  cefUROXime (CEFTIN) 250 MG tablet Take 1 tablet (250 mg total) by mouth 2 (two) times daily with a meal. 09/21/14   Graylon Good, PA-C  cyclobenzaprine (FLEXERIL) 5 MG tablet Take 1 tablet (5 mg total) by mouth 3 (three) times daily as needed for muscle spasms. 11/09/13   Reuben Likes, MD  HYDROcodone-acetaminophen (NORCO/VICODIN) 5-325 MG per tablet 1 to 2 tabs every 4 to 6 hours as needed for pain. 11/09/13   Reuben Likes, MD  meloxicam (MOBIC) 15 MG tablet Take 1 tablet (15 mg total) by mouth daily. 11/09/13   Reuben Likes, MD  phenazopyridine  (PYRIDIUM) 200 MG tablet Take 1 tablet (200 mg total) by mouth 3 (three) times daily. 09/21/14   Graylon Good, PA-C   Meds Ordered and Administered this Visit  Medications - No data to display  BP 99/65 mmHg  Pulse 78  Temp(Src) 98.5 F (36.9 C) (Oral)  Resp 18  SpO2 95%  LMP 09/04/2015 No data found.   Physical Exam  Constitutional: She is oriented to person, place, and time. She appears well-developed and well-nourished. No distress.  HENT:  Bilateral TMs are normal. Oropharynx with minor erythema and clear PND. No exudates.  Eyes: Conjunctivae and EOM are normal.  Neck: Normal range of motion. Neck supple.  Cardiovascular: Normal rate, regular rhythm and normal heart sounds.   Pulmonary/Chest: Effort normal and breath sounds normal. No respiratory distress.  Musculoskeletal: Normal range of motion. She exhibits no edema.  Lymphadenopathy:    She has no cervical adenopathy.  Neurological: She is alert and oriented to person, place, and time.  Skin: Skin is warm and dry. No rash noted.  Psychiatric: She has a normal mood and affect.  Nursing note and vitals reviewed.   ED Course  Procedures (including critical care time)  Labs Review Labs Reviewed  POCT RAPID STREP A    Imaging Review No results found.   Visual Acuity Review  Right Eye Distance:   Left Eye Distance:   Bilateral Distance:    Right Eye Near:   Left Eye Near:    Bilateral Near:  MDM   1. Pharyngitis    Your strep test was negative. The test will be double tested and resulted in 2-3 days. If it is positive we will call you would treat over the phone. Treacher symptoms with ibuprofen 6 mg every 6 hours as needed. Cepacol lozenges for sore throat pain, drink plenty fluids and stay well-hydrated.     Hayden Rasmussen, NP 09/16/15 [phone]

## 2015-09-17 LAB — CULTURE, GROUP A STREP (THRC)

## 2015-09-18 ENCOUNTER — Telehealth: Payer: Self-pay | Admitting: Internal Medicine

## 2015-09-18 DIAGNOSIS — J020 Streptococcal pharyngitis: Secondary | ICD-10-CM

## 2015-09-18 MED ORDER — PENICILLIN V POTASSIUM 500 MG PO TABS: 500.0000 mg | ORAL_TABLET | Freq: Two times a day (BID) | ORAL | Status: AC

## 2015-09-18 NOTE — ED Notes (Signed)
Throat culture obtained at [facility] visit 09/16/15 growing group A strep.  Rx penicillin V sent to the [facility] on [address] (pharmacy of record).  Please let patient know.  LM  Eustace Moore, MD 09/18/15 1140

## 2017-06-10 ENCOUNTER — Ambulatory Visit (INDEPENDENT_AMBULATORY_CARE_PROVIDER_SITE_OTHER): Payer: BLUE CROSS/BLUE SHIELD

## 2017-06-10 ENCOUNTER — Ambulatory Visit (HOSPITAL_COMMUNITY)
Admission: EM | Admit: 2017-06-10 | Discharge: 2017-06-10 | Disposition: A | Discharge status: Home / Self Care | Payer: BLUE CROSS/BLUE SHIELD | Attending: Family Medicine | Admitting: Family Medicine

## 2017-06-10 ENCOUNTER — Encounter (HOSPITAL_COMMUNITY): Payer: Self-pay | Admitting: Family Medicine

## 2017-06-10 DIAGNOSIS — R0602 Shortness of breath: Secondary | ICD-10-CM

## 2017-06-10 DIAGNOSIS — R0789 Other chest pain: Secondary | ICD-10-CM

## 2017-06-10 DIAGNOSIS — R079 Chest pain, unspecified: Secondary | ICD-10-CM

## 2017-06-10 NOTE — ED Provider Notes (Signed)
[facility]    CSN: [id number] Arrival date & time: 06/10/17  1927     History   Chief Complaint Chief Complaint  Patient presents with  . Pleurisy    HPI Laura Day is a 26 y.o. female.   Laura Day presents with complaints of upper chest pain which started approximately 1-2 weeks ago. She states she stopped smoking at that time but symptoms have not improved. She states she feels short of breath with activity at times, as well as at times when she is at rest and that she needs to sit to catch her breath. Without palpitations. She states the pain doesn't seem to relate to activity or rest in particular. The pain goes across her chest. It does not radiate to jaw or arm. It is not associated with nausea, vomiting or diaphoresis. No pain with deep breathing, without cough. Rates pain 4/10, states it "just feels off." has not taken any medications for her symptoms. Has not had any previous similar. She states she feels fatigued. Without fevers, or uri symptoms. Without loss of consciousness or dizziness. She states she works out regularly but hasn't worked out in the past two weeks. Without any cardiac or lung history. Is not on any birth control. Denies leg or calf pain.    ROS per HPI.       History reviewed. No pertinent past medical history.  There are no active problems to display for this patient.   History reviewed. No pertinent surgical history.  OB History    No data available       Home Medications    Prior to Admission medications   Medication Sig Start Date End Date Taking? Authorizing Provider  aspirin 325 MG tablet Take 325 mg by mouth daily.    [provider]  cefUROXime (CEFTIN) 250 MG tablet Take 1 tablet (250 mg total) by mouth 2 (two) times daily with a meal. 09/21/14   Baker, Adrian Blackwater, PA-C  cyclobenzaprine (FLEXERIL) 5 MG tablet Take 1 tablet (5 mg total) by mouth 3 (three) times daily as needed for muscle spasms. 11/09/13   Reuben Likes, MD  HYDROcodone-acetaminophen (NORCO/VICODIN) 5-325 MG per tablet 1 to 2 tabs every 4 to 6 hours as needed for pain. 11/09/13   Reuben Likes, MD  meloxicam (MOBIC) 15 MG tablet Take 1 tablet (15 mg total) by mouth daily. 11/09/13   Reuben Likes, MD  penicillin v potassium (VEETID) 500 MG tablet Take 1 tablet (500 mg total) by mouth 2 (two) times daily. 09/18/15   Eustace Moore, MD  phenazopyridine (PYRIDIUM) 200 MG tablet Take 1 tablet (200 mg total) by mouth 3 (three) times daily. 09/21/14   Graylon Good, PA-C    Family History History reviewed. No pertinent family history.  Social History Social History  Substance Use Topics  . Smoking status: Never Smoker  . Smokeless tobacco: Not on file  . Alcohol use No     Allergies   Patient has no known allergies.   Review of Systems Review of Systems   Physical Exam Triage Vital Signs ED Triage Vitals  Enc Vitals Group     BP 06/10/17 1951 112/69     Pulse Rate 06/10/17 1951 61     Resp 06/10/17 1951 18     Temp 06/10/17 1951 98.3 F (36.8 C)     Temp src --      SpO2 06/10/17 1951 100 %     Weight --  Height --      Head Circumference --      Peak Flow --      Pain Score 06/10/17 1950 4     Pain Loc --      Pain Edu? --      Excl. in GC? --    No data found.   Updated Vital Signs BP 112/69   Pulse 61   Temp 98.3 F (36.8 C)   Resp 18   LMP 04/26/2017   SpO2 100%   Visual Acuity Right Eye Distance:   Left Eye Distance:   Bilateral Distance:    Right Eye Near:   Left Eye Near:    Bilateral Near:     Physical Exam  Constitutional: She is oriented to person, place, and time. She appears well-developed and well-nourished.  Eyes: Pupils are equal, round, and reactive to light. EOM are normal.  Neck: Normal range of motion.  Cardiovascular: Normal rate, regular rhythm, normal heart sounds and normal pulses.   Pulmonary/Chest: Effort normal and breath sounds normal. No respiratory  distress. She has no decreased breath sounds. She has no wheezes. She exhibits tenderness.    Confirms anterior chest wall tenderness with palpation  Musculoskeletal: Normal range of motion.  Neurological: She is alert and oriented to person, place, and time. No sensory deficit. She exhibits normal muscle tone.  Skin: Skin is warm and dry.  Vitals reviewed.   EKG NSR, rate of 71; without acute changes     UC Treatments / Results  Labs (all labs ordered are listed, but only abnormal results are displayed) Labs Reviewed - No data to display  EKG  EKG Interpretation None       Radiology Dg Chest 2 View  Result Date: 06/10/2017 CLINICAL DATA:  Chest pain short of breath EXAM: CHEST  2 VIEW COMPARISON:  None. FINDINGS: The heart size and mediastinal contours are within normal limits. Both lungs are clear. The visualized skeletal structures are unremarkable. IMPRESSION: No active cardiopulmonary disease. Electronically Signed   By: Marlan Palauharles  Clark M.D.   On: 06/10/2017 20:47    Procedures Procedures (including critical care time)  Medications Ordered in UC Medications - No data to display   Initial Impression / Assessment and Plan / UC Course  I have reviewed the triage vital signs and the nursing notes.  Pertinent labs & imaging results that were available during my care of the patient were reviewed by me and considered in my medical decision making (see chart for details).     Without tachypnea, tachycardia, fever, or hypoxia. Without cough. Pain is reproducible with palpation. Without diabetes, obesity, hypertension or other cardiac risk factors for ACS. EKG WNL. Non distress, non toxic in appearance. Chest xray WNL without acute findings. Differentials include pleurisy, chest wall pain, PE, ACS, URI, reflux, anxiety. NSAIDS recommended, as well as daily antacid to see if pain improves. If symptoms worsen or do not improve in the next week to return to be seen or to follow  up with PCP. Discussed signs to return sooner. Patient verbalized understanding and agreeable to plan. Ambulatory out of clinic without difficulty.    Georgetta HaberNatalie B Burky, NP 06/10/2017 9:00 PM        Final Clinical Impressions(s) / UC Diagnoses   Final diagnoses:  Chest wall pain    New Prescriptions New Prescriptions   No medications on file     Controlled Substance Prescriptions  Controlled Substance Registry consulted? Not Applicable   Linus MakoBurky, Natalie  B, NP 06/10/17 2100

## 2017-06-10 NOTE — ED Triage Notes (Signed)
Pt here for chest pain x 2 weeks with SOB. Reports that she quit smoking 2 weeks ago. Reports she may be anemic.

## 2017-06-10 NOTE — Discharge Instructions (Signed)
I recommend trying ibuprofen or aleve regularly for the next few days, take with food, to see if pain improves. Light activity. Take your prilosec daily. If your symptoms persist please follow up with a primary care doctor for reevaluation. If you develop worsening of pain, shortness of breath, dizziness, fevers, difficulty breathing, palpitations please go to the er

## 2018-04-30 IMAGING — DX DG CHEST 2V
2 series · 2 of 2 positions shown · non-contrast
Comparison: None.

CLINICAL DATA: Chest pain short of breath

EXAM:
CHEST  2 VIEW

[chest pa]
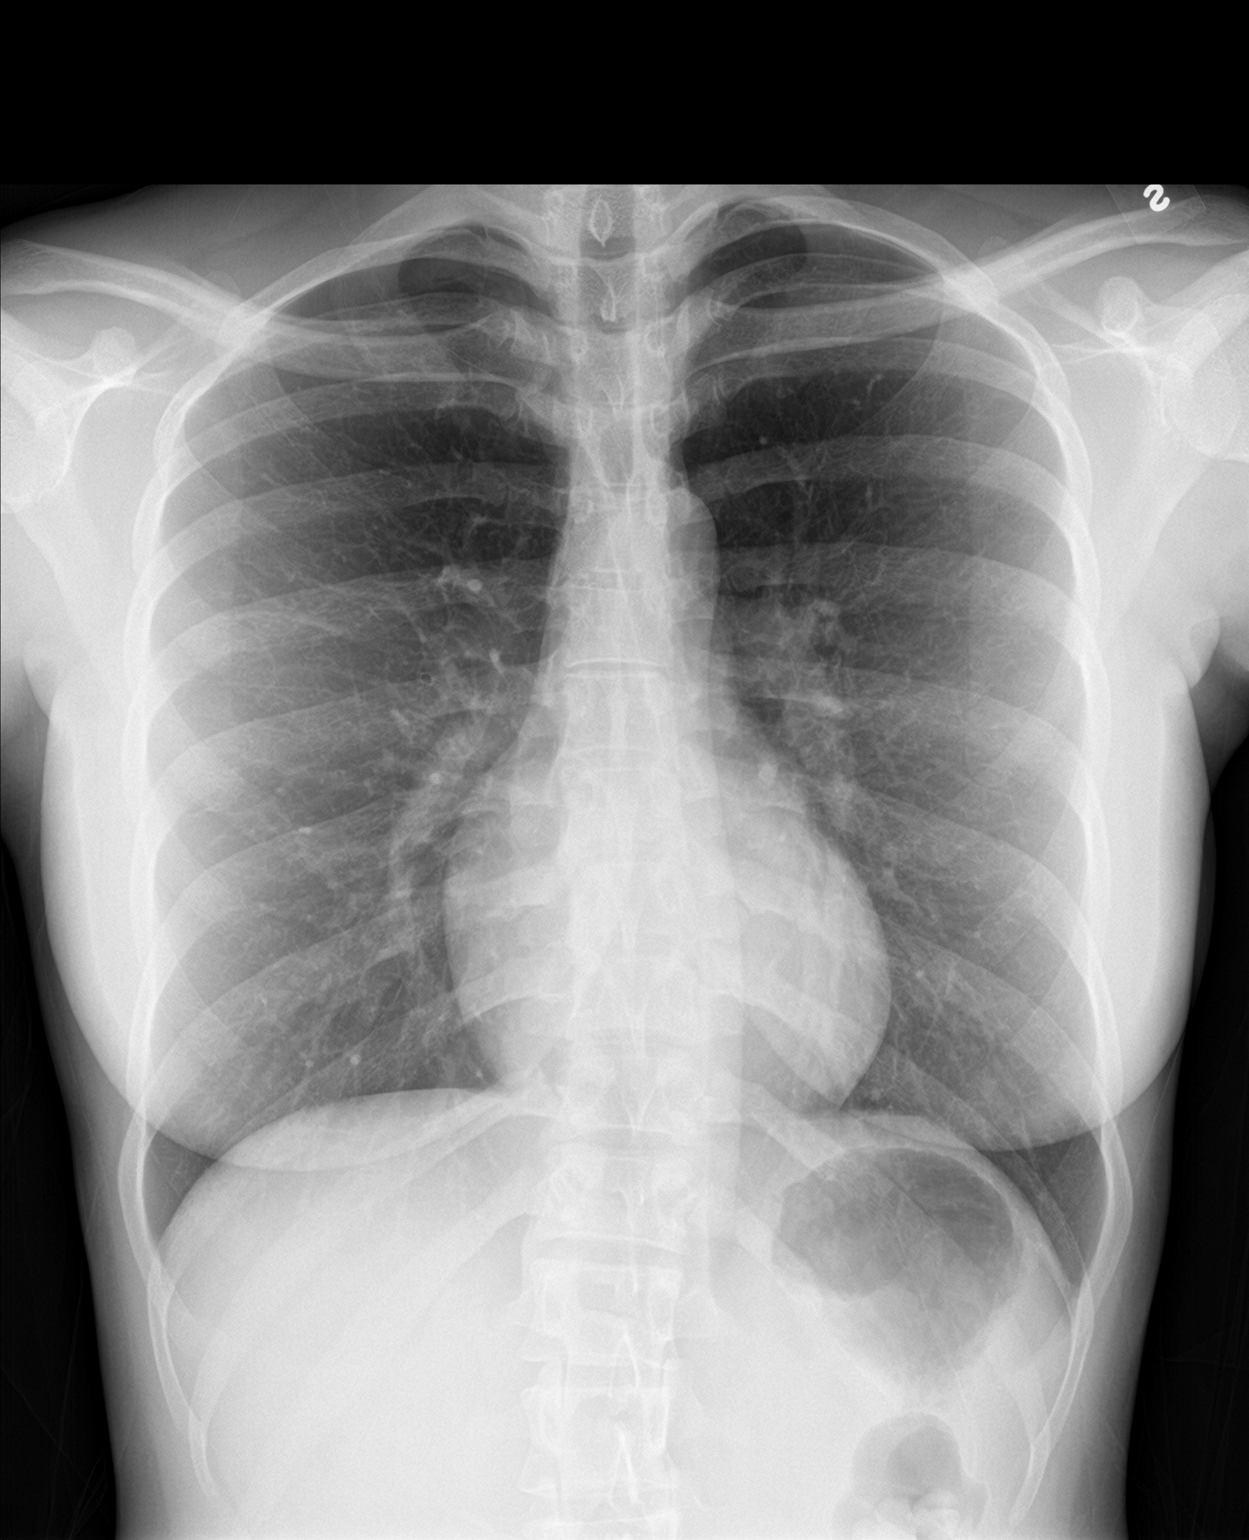

[chest lat]
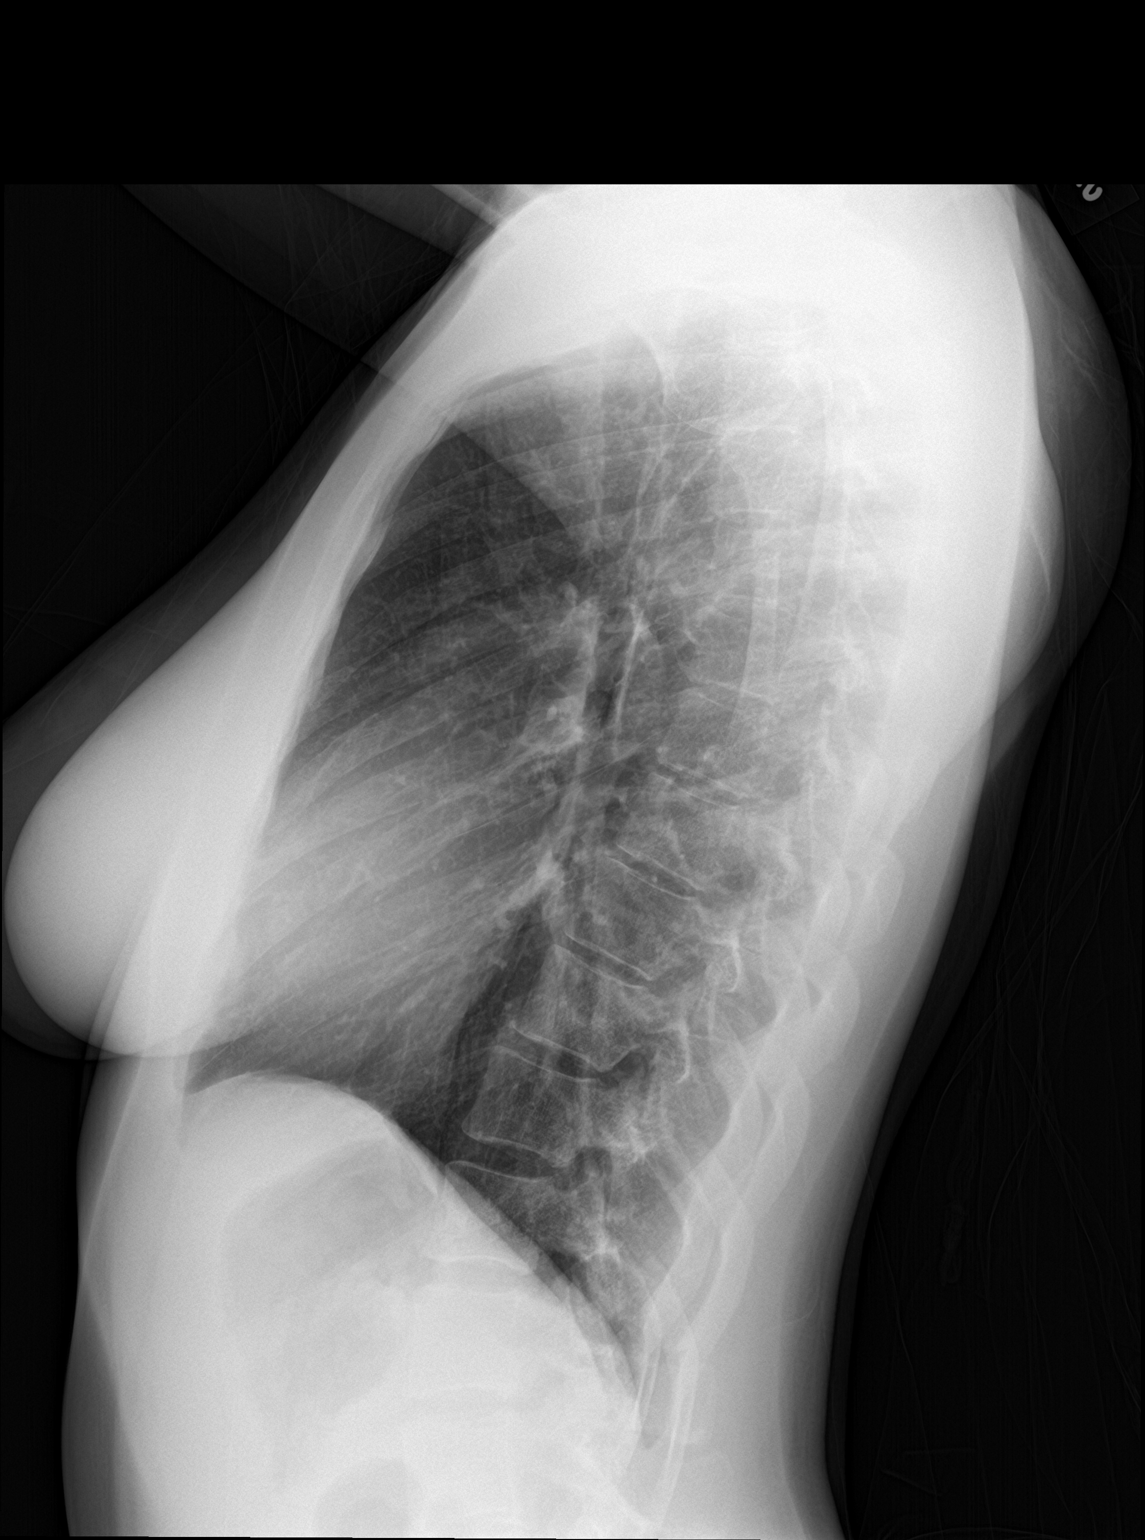

[2 of 2 positions shown; findings below may reference images not displayed]

FINDINGS: The heart size and mediastinal contours are within normal limits.
Both lungs are clear. The visualized skeletal structures are
unremarkable.
IMPRESSION: No active cardiopulmonary disease.

## 2020-04-11 ENCOUNTER — Other Ambulatory Visit (HOSPITAL_COMMUNITY)
Admission: RE | Admit: 2020-04-11 | Discharge: 2020-04-11 | Disposition: A | Discharge status: Home / Self Care | Payer: BC Managed Care – PPO | Source: Ambulatory Visit | Attending: Physician Assistant | Admitting: Physician Assistant

## 2020-04-11 ENCOUNTER — Other Ambulatory Visit: Payer: Self-pay | Admitting: Physician Assistant

## 2020-04-11 DIAGNOSIS — Z124 Encounter for screening for malignant neoplasm of cervix: Secondary | ICD-10-CM | POA: Diagnosis not present

## 2020-04-12 LAB — CYTOLOGY - PAP
Adequacy: ABSENT
Comment: NEGATIVE
Diagnosis: NEGATIVE
High risk HPV: NEGATIVE

## 2020-08-29 ENCOUNTER — Ambulatory Visit: Payer: BC Managed Care – PPO

## 2020-09-12 ENCOUNTER — Ambulatory Visit: Payer: Self-pay
